# Patient Record
Sex: Female | Born: 1990 | Race: Black or African American | Hispanic: No | Marital: Single | Smoking: Never smoker
Health system: Southern US, Community
[De-identification: ages and names within clinical notes are randomized; demographics above are authoritative.]

## PROBLEM LIST (undated history)

## (undated) DIAGNOSIS — Z789 Other specified health status: Secondary | ICD-10-CM

## (undated) DIAGNOSIS — N946 Dysmenorrhea, unspecified: Secondary | ICD-10-CM

## (undated) HISTORY — PX: NO PAST SURGERIES: SHX2092

---

## 2012-03-16 ENCOUNTER — Encounter (HOSPITAL_COMMUNITY): Payer: Self-pay | Admitting: *Deleted

## 2012-03-16 DIAGNOSIS — Y939 Activity, unspecified: Secondary | ICD-10-CM | POA: Insufficient documentation

## 2012-03-16 DIAGNOSIS — N946 Dysmenorrhea, unspecified: Secondary | ICD-10-CM | POA: Insufficient documentation

## 2012-03-16 DIAGNOSIS — S8990XA Unspecified injury of unspecified lower leg, initial encounter: Secondary | ICD-10-CM | POA: Insufficient documentation

## 2012-03-16 DIAGNOSIS — Y929 Unspecified place or not applicable: Secondary | ICD-10-CM | POA: Insufficient documentation

## 2012-03-16 DIAGNOSIS — M2540 Effusion, unspecified joint: Secondary | ICD-10-CM | POA: Insufficient documentation

## 2012-03-16 DIAGNOSIS — S99929A Unspecified injury of unspecified foot, initial encounter: Secondary | ICD-10-CM | POA: Insufficient documentation

## 2012-03-16 DIAGNOSIS — W06XXXA Fall from bed, initial encounter: Secondary | ICD-10-CM | POA: Insufficient documentation

## 2012-03-16 NOTE — ED Notes (Signed)
Pt to ED c/o of her R "knee popping out" after she jumped up on her bed.  States she could not move the limb until the knee "popped back in place".  Pt was able to ambulate to the car with help.

## 2012-03-17 ENCOUNTER — Emergency Department (HOSPITAL_COMMUNITY)
Admission: EM | Admit: 2012-03-17 | Discharge: 2012-03-17 | Disposition: A | Discharge status: Home / Self Care | Payer: BC Managed Care – PPO | Attending: Emergency Medicine | Admitting: Emergency Medicine

## 2012-03-17 ENCOUNTER — Emergency Department (HOSPITAL_COMMUNITY): Payer: BC Managed Care – PPO

## 2012-03-17 DIAGNOSIS — M25569 Pain in unspecified knee: Secondary | ICD-10-CM

## 2012-03-17 HISTORY — DX: Dysmenorrhea, unspecified: N94.6

## 2012-03-17 MED ORDER — IBUPROFEN 400 MG PO TABS
600.0000 mg | ORAL_TABLET | Freq: Once | ORAL | Status: DC
  Filled 2012-03-17: qty 1

## 2012-03-17 MED ORDER — IBUPROFEN 100 MG/5ML PO SUSP
600.0000 mg | Freq: Once | ORAL | Status: AC
  Administered 2012-03-17: 600 mg via ORAL
  Filled 2012-03-17 (×2): qty 30

## 2012-03-17 MED ORDER — IBUPROFEN 600 MG PO TABS: 600.0000 mg | ORAL_TABLET | Freq: Four times a day (QID) | ORAL | Status: DC | PRN

## 2012-03-17 NOTE — ED Provider Notes (Signed)
History     CSN: [id number]  Arrival date & time 03/16/12  2344   First MD Initiated Contact with Patient 03/17/12 0012      Chief Complaint  Patient presents with  . Knee Pain    (Consider location/radiation/quality/duration/timing/severity/associated sxs/prior treatment) HPI Comments: Was in bed jumped up and felt R knee cap POP out of place states it was to the side instead of in the middle she tried to straighten her leg and it popped back   Patient is a 21 y.o. female presenting with knee pain. The history is provided by the patient.  Knee Pain This is a new problem. The current episode started today. The problem has been gradually improving. Associated symptoms include joint swelling. Pertinent negatives include no chills, fever, numbness or weakness.    Past Medical History  Diagnosis Date  . Dysmenorrhea     History reviewed. No pertinent past surgical history.  No family history on file.  History  Substance Use Topics  . Smoking status: Never Smoker   . Smokeless tobacco: Not on file  . Alcohol Use: No    OB History    Grav Para Term Preterm Abortions TAB SAB Ect Mult Living                  Review of Systems  Constitutional: Negative for fever and chills.  Musculoskeletal: Positive for joint swelling and gait problem.  Neurological: Negative for weakness and numbness.    Allergies  Review of patient's allergies indicates no known allergies.  Home Medications  No current outpatient prescriptions on file.  BP 115/80  Pulse 82  Temp 98.4 F (36.9 C)  Resp 16  SpO2 100%  LMP 03/03/2012  Physical Exam  Constitutional: She is oriented to person, place, and time. She appears well-developed and well-nourished.  HENT:  Head: Normocephalic.  Neck: Normal range of motion.  Cardiovascular: Normal rate.   Pulmonary/Chest: Effort normal.  Musculoskeletal: She exhibits tenderness. She exhibits no edema.       Pteitn can not fully straighten her R  leg no joint swelling exam limited by pain   Neurological: She is alert and oriented to person, place, and time.  Skin: Skin is warm and dry. No rash noted. No erythema.    ED Course  Procedures (including critical care time)  Labs Reviewed - No data to display Dg Knee Complete 4 Views Right  03/17/2012  *RADIOLOGY REPORT*  Clinical Data: Injury, pain.  RIGHT KNEE - COMPLETE 4+ VIEW  Comparison: None.  Findings: Imaged bones, joints and soft tissues appear normal.  IMPRESSION: Negative exam.   Original Report Authenticated By: Holley Dexter, M.D.      1. Knee pain       MDM   Xray reviewed no fracture or dislocation DC home with knee sleeve and Ibuprofen         Arman Filter, NP 03/17/12 0127

## 2012-03-17 NOTE — ED Provider Notes (Signed)
Medical screening examination/treatment/procedure(s) were performed by non-physician practitioner and as supervising physician I was immediately available for consultation/collaboration.  Malkie Wille K Keeghan Mcintire-Rasch, MD 03/17/12 0238 

## 2012-03-17 NOTE — ED Notes (Signed)
Pt reports jumping on her bed tonight when she felt her (R) knee "pop" out. Pt unable to fully extend her knee

## 2013-05-26 ENCOUNTER — Encounter (HOSPITAL_COMMUNITY): Payer: Self-pay | Admitting: *Deleted

## 2013-05-26 ENCOUNTER — Inpatient Hospital Stay (HOSPITAL_COMMUNITY)
Admission: AD | Admit: 2013-05-26 | Discharge: 2013-05-26 | Disposition: A | Discharge status: Home / Self Care | Payer: BC Managed Care – PPO | Source: Ambulatory Visit | Attending: Obstetrics & Gynecology | Admitting: Obstetrics & Gynecology

## 2013-05-26 DIAGNOSIS — D649 Anemia, unspecified: Secondary | ICD-10-CM | POA: Insufficient documentation

## 2013-05-26 DIAGNOSIS — N938 Other specified abnormal uterine and vaginal bleeding: Secondary | ICD-10-CM

## 2013-05-26 DIAGNOSIS — N949 Unspecified condition associated with female genital organs and menstrual cycle: Secondary | ICD-10-CM | POA: Insufficient documentation

## 2013-05-26 DIAGNOSIS — N925 Other specified irregular menstruation: Secondary | ICD-10-CM

## 2013-05-26 HISTORY — DX: Other specified health status: Z78.9

## 2013-05-26 LAB — URINALYSIS, ROUTINE W REFLEX MICROSCOPIC
Bilirubin Urine: NEGATIVE
Glucose, UA: NEGATIVE mg/dL
Ketones, ur: NEGATIVE mg/dL
Leukocytes, UA: NEGATIVE
NITRITE: NEGATIVE
Protein, ur: NEGATIVE mg/dL
UROBILINOGEN UA: 0.2 mg/dL (ref 0.0–1.0)
pH: 5.5 (ref 5.0–8.0)

## 2013-05-26 LAB — COMPREHENSIVE METABOLIC PANEL
ALK PHOS: 50 U/L (ref 39–117)
ALT: 6 U/L (ref 0–35)
AST: 18 U/L (ref 0–37)
Albumin: 3.9 g/dL (ref 3.5–5.2)
BUN: 5 mg/dL — ABNORMAL LOW (ref 6–23)
CALCIUM: 9.4 mg/dL (ref 8.4–10.5)
CO2: 24 meq/L (ref 19–32)
Chloride: 103 mEq/L (ref 96–112)
Creatinine, Ser: 0.68 mg/dL (ref 0.50–1.10)
GFR calc Af Amer: >90 mL/min (ref >90)
GFR calc non Af Amer: >90 mL/min (ref >90)
Glucose, Bld: 94 mg/dL (ref 70–99)
POTASSIUM: 4 meq/L (ref 3.7–5.3)
SODIUM: 139 meq/L (ref 137–147)
TOTAL PROTEIN: 6.9 g/dL (ref 6.0–8.3)
Total Bilirubin: <0.2 mg/dL — ABNORMAL LOW (ref 0.3–1.2)

## 2013-05-26 LAB — CBC
HCT: 24.8 % — ABNORMAL LOW (ref 36.0–46.0)
HEMOGLOBIN: 7.1 g/dL — AB (ref 12.0–15.0)
MCH: 20.9 pg — AB (ref 26.0–34.0)
MCHC: 28.6 g/dL — AB (ref 30.0–36.0)
MCV: 73.2 fL — ABNORMAL LOW (ref 78.0–100.0)
PLATELETS: 408 10*3/uL — AB (ref 150–400)
RBC: 3.39 MIL/uL — AB (ref 3.87–5.11)
RDW: 16.4 % — ABNORMAL HIGH (ref 11.5–15.5)
WBC: 4.5 10*3/uL (ref 4.0–10.5)

## 2013-05-26 LAB — URINE MICROSCOPIC-ADD ON

## 2013-05-26 LAB — POCT PREGNANCY, URINE: PREG TEST UR: NEGATIVE

## 2013-05-26 MED ORDER — ESTROGENS CONJUGATED 1.25 MG PO TABS: 1.2500 mg | ORAL_TABLET | Freq: Every day | ORAL | Status: AC

## 2013-05-26 MED ORDER — INTEGRA F 125-1 MG PO CAPS: 1.0000 | ORAL_CAPSULE | Freq: Two times a day (BID) | ORAL | Status: AC

## 2013-05-26 MED ORDER — SODIUM CHLORIDE 0.9 % IV SOLN
INTRAVENOUS | Status: DC
  Administered 2013-05-26: 22:00:00 via INTRAVENOUS

## 2013-05-26 MED ORDER — FERUMOXYTOL INJECTION 510 MG/17 ML
510.0000 mg | Freq: Once | INTRAVENOUS | Status: AC
  Administered 2013-05-26: 510 mg via INTRAVENOUS
  Filled 2013-05-26: qty 17

## 2013-05-26 NOTE — MAU Note (Signed)
Three months [facility] and have been bleeding ever since. I'm fatigued and weak. Some dizziness. Before i left [employer]choool for holidays the [employer] doctor said my blood was dropping but still in normal range. Doctor gave me Estrogen and told me to take vitamins.

## 2013-05-26 NOTE — Progress Notes (Signed)
W. Muhammed CNM in earlier to discuss test results and d/c plan. Written and verbal d/c instructions given and understanding voiced. 

## 2013-05-26 NOTE — MAU Provider Note (Signed)
History     CSN: [id number]  Arrival date and time: 05/26/13 [id number]   First Provider Initiated Contact with Patient 05/26/13 2012      Chief Complaint  Patient presents with  . Vaginal Bleeding   HPI  Pt is a 23 yo G0P0 here with report of vaginal bleeding.  [facility] and have been bleeding ever since.  Reports bleeding every day and needing 8-10 geriatric diapers a day in the past two weeks   Passing plum sized clot "every time I stand.   I'm fatigued and weak". Some dizziness. Before i left [employer]choool for holidays [facility] said my blood was dropping but still in normal range.  Given RX for estrogen and told to take vitamins.     Past Medical History  Diagnosis Date  . Dysmenorrhea   . Medical history non-contributory     Past Surgical History  Procedure Laterality Date  . No past surgeries      Family History  Problem Relation Age of Onset  . Diabetes Father   . Cancer Maternal Aunt   . Diabetes Maternal Aunt   . Cancer Maternal Grandmother   . Diabetes Maternal Grandfather   . Arthritis Paternal Grandmother   . Cancer Paternal Grandfather     History  Substance Use Topics  . Smoking status: Never Smoker   . Smokeless tobacco: Not on file  . Alcohol Use: No    Allergies: No Known Allergies  Prescriptions prior to admission  Medication Sig Dispense Refill  . acetaminophen (TYLENOL) 500 MG tablet Take 500 mg by mouth every 6 (six) hours as needed.      . Multiple Vitamins-Minerals (MULTIVITAMIN WITH MINERALS) tablet Take 1 tablet by mouth daily.      Marland Kitchen. ibuprofen (ADVIL,MOTRIN) 600 MG tablet Take 1 tablet (600 mg total) by mouth every 6 (six) hours as needed for pain.  30 tablet  0    Review of Systems  Constitutional: Positive for malaise/fatigue.  Cardiovascular: Positive for palpitations. Negative for chest pain.  Gastrointestinal: Positive for nausea. Negative for vomiting and abdominal pain.  Genitourinary:       Vaginal bleeding   Neurological: Positive for dizziness. Negative for loss of consciousness.   Physical Exam   Blood pressure 134/78, pulse 91, temperature 98.4 F (36.9 C), temperature source Oral, resp. rate 20, height 5\' 3"  (1.6 m), weight 60.782 kg (134 lb), last menstrual period 02/01/2013.  Physical Exam  Constitutional: She is oriented to person, place, and time. She appears well-developed and well-nourished. No distress.  HENT:  Head: Normocephalic.  Neck: Normal range of motion. Neck supple.  Cardiovascular: Normal rate, regular rhythm and normal heart sounds.   Respiratory: Effort normal and breath sounds normal.  GI: Soft. There is no tenderness.  Genitourinary: There is bleeding (negative clots; scant to moderate bleeding) around the vagina.  Neurological: She is alert and oriented to person, place, and time. She has normal reflexes.  Skin: Skin is warm and dry.    MAU Course  Procedures Results for orders placed during the hospital encounter of 05/26/13 (from the past 24 hour(s))  URINALYSIS, ROUTINE W REFLEX MICROSCOPIC     Status: Abnormal   Collection Time    05/26/13  7:45 PM      Result Value Range   Color, Urine YELLOW  YELLOW   APPearance CLEAR  CLEAR   Specific Gravity, Urine <1.005 (*) 1.005 - 1.030   pH 5.5  5.0 - 8.0  Glucose, UA NEGATIVE  NEGATIVE mg/dL   Hgb urine dipstick LARGE (*) NEGATIVE   Bilirubin Urine NEGATIVE  NEGATIVE   Ketones, ur NEGATIVE  NEGATIVE mg/dL   Protein, ur NEGATIVE  NEGATIVE mg/dL   Urobilinogen, UA 0.2  0.0 - 1.0 mg/dL   Nitrite NEGATIVE  NEGATIVE   Leukocytes, UA NEGATIVE  NEGATIVE  URINE MICROSCOPIC-ADD ON     Status: Abnormal   Collection Time    05/26/13  7:45 PM      Result Value Range   Squamous Epithelial / LPF FEW (*) RARE   WBC, UA 0-2  <3 WBC/hpf   RBC / HPF 21-50  <3 RBC/hpf   Bacteria, UA RARE  RARE  POCT PREGNANCY, URINE     Status: None   Collection Time    05/26/13  7:54 PM      Result Value Range   Preg Test, Ur  NEGATIVE  NEGATIVE  CBC     Status: Abnormal   Collection Time    05/26/13  8:25 PM      Result Value Range   WBC 4.5  4.0 - 10.5 K/uL   RBC 3.39 (*) 3.87 - 5.11 MIL/uL   Hemoglobin 7.1 (*) 12.0 - 15.0 g/dL   HCT 16.1 (*) 09.6 - 04.5 %   MCV 73.2 (*) 78.0 - 100.0 fL   MCH 20.9 (*) 26.0 - 34.0 pg   MCHC 28.6 (*) 30.0 - 36.0 g/dL   RDW 40.9 (*) 81.1 - 91.4 %   Platelets 408 (*) 150 - 400 K/uL  COMPREHENSIVE METABOLIC PANEL     Status: Abnormal   Collection Time    05/26/13  8:25 PM      Result Value Range   Sodium 139  137 - 147 mEq/L   Potassium 4.0  3.7 - 5.3 mEq/L   Chloride 103  96 - 112 mEq/L   CO2 24  19 - 32 mEq/L   Glucose, Bld 94  70 - 99 mg/dL   BUN 5 (*) 6 - 23 mg/dL   Creatinine, Ser 7.82  0.50 - 1.10 mg/dL   Calcium 9.4  8.4 - 95.6 mg/dL   Total Protein 6.9  6.0 - 8.3 g/dL   Albumin 3.9  3.5 - 5.2 g/dL   AST 18  0 - 37 U/L   ALT 6  0 - 35 U/L   Alkaline Phosphatase 50  39 - 117 U/L   Total Bilirubin <0.2 (*) 0.3 - 1.2 mg/dL   GFR calc non Af Amer >90  >90 mL/min   GFR calc Af Amer >90  >90 mL/min   Consulted with Dr. Erin Fulling > reviewed HPI/exam/labs > given IV Iron and discharge home on Premarin and Integra.   I dose of IV Iron I L NS Assessment and Plan  Abnormal Uterine Bleeding Anemia  Plan: Discharge to home RX Integra BID Premarin 2.5 mg QD Follow-up in GYN clinic in one month    [facility] 05/26/2013, 8:13 PM

## 2013-05-26 NOTE — Discharge Instructions (Signed)
Abnormal Uterine Bleeding °Abnormal uterine bleeding means bleeding from the vagina that is not your normal menstrual period. This can be: °· Bleeding or spotting between periods. °· Bleeding after sex (sexual intercourse). °· Bleeding that is heavier or more than normal. °· Periods that last longer than usual. °· Bleeding after menopause. °There are many problems that may cause this. Treatment will depend on the cause of the bleeding. Any kind of bleeding that is not normal should be reviewed by your doctor.  °HOME CARE °Watch your condition for any changes. These actions may lessen any discomfort you are having: °· Do not use tampons or douches as told by your doctor. °· Change your pads often. °You should get regular pelvic exams and Pap tests. Keep all appointments for tests as told by your doctor. °GET HELP IF: °· You are bleeding for more than 1 week. °· You feel dizzy at times. °GET HELP RIGHT AWAY IF:  °· You pass out. °· You have to change pads every 15 to 30 minutes. °· You have belly pain. °· You have a fever. °· You become sweaty or weak. °· You are passing large blood clots from the vagina. °· You feel sick to your stomach (nauseous) and throw up (vomit). °MAKE SURE YOU: °· Understand these instructions. °· Will watch your condition. °· Will get help right away if you are not doing well or get worse. °Document Released: 02/28/2009 Document Revised: 02/21/2013 Document Reviewed: 11/30/2012 °ExitCare® Patient Information ©2014 [employer]. ° °Anemia, Nonspecific °Anemia is a condition in which the concentration of red blood cells or hemoglobin in the blood is below normal. Hemoglobin is a substance in red blood cells that carries oxygen to the tissues of the body. Anemia results in not enough oxygen reaching these tissues.  °CAUSES  °Common causes of anemia include:  °· Excessive bleeding. Bleeding may be internal or external. This includes excessive bleeding from periods (in women) or from the  intestine.   °· Poor nutrition.   °· Chronic kidney, thyroid, and liver disease.  °· Bone marrow disorders that decrease red blood cell production. °· Cancer and treatments for cancer. °· HIV, AIDS, and their treatments. °· Spleen problems that increase red blood cell destruction. °· Blood disorders. °· Excess destruction of red blood cells due to infection, medicines, and autoimmune disorders. °SIGNS AND SYMPTOMS  °· Minor weakness.   °· Dizziness.   °· Headache. °· Palpitations.   °· Shortness of breath, especially with exercise.   °· Paleness. °· Cold sensitivity. °· Indigestion. °· Nausea. °· Difficulty sleeping. °· Difficulty concentrating. °Symptoms may occur suddenly or they may develop slowly.  °DIAGNOSIS  °Additional blood tests are often needed. These help your health care provider determine the best treatment. Your health care provider will check your stool for blood and look for other causes of blood loss.  °TREATMENT  °Treatment varies depending on the cause of the anemia. Treatment can include:  °· Supplements of iron, vitamin B12, or folic acid.   °· Hormone medicines.   °· A blood transfusion. This may be needed if blood loss is severe.   °· Hospitalization. This may be needed if there is significant continual blood loss.   °· Dietary changes. °· Spleen removal. °HOME CARE INSTRUCTIONS °Keep all follow-up appointments. It often takes many weeks to correct anemia, and having your health care provider check on your condition and your response to treatment is very important. °SEEK IMMEDIATE MEDICAL CARE IF:  °· You develop extreme weakness, shortness of breath, or chest pain.   °· You become   dizzy or have trouble concentrating. °· You develop heavy vaginal bleeding.   °· You develop a rash.   °· You have bloody or black, tarry stools.   °· You faint.   °· You vomit up blood.   °· You vomit repeatedly.   °· You have abdominal pain. °· You have a fever or persistent symptoms for more than 2 3 days.    °· You have a fever and your symptoms suddenly get worse.   °· You are dehydrated.   °MAKE SURE YOU: °· Understand these instructions. °· Will watch your condition. °· Will get help right away if you are not doing well or get worse. °Document Released: 06/10/2004 Document Revised: 01/03/2013 Document Reviewed: 10/27/2012 °ExitCare® Patient Information ©2014 [employer]. ° °

## 2013-05-26 NOTE — MAU Provider Note (Signed)
Attestation of Attending Supervision of Advanced Practitioner (CNM/NP): Evaluation and management procedures were performed by the Advanced Practitioner under my supervision and collaboration.  I have reviewed the Advanced Practitioner's note and chart, and I agree with the management and plan.  HARRAWAY-SMITH, Rowene Suto 11:40 PM   \  

## 2013-05-28 ENCOUNTER — Other Ambulatory Visit: Payer: Self-pay | Admitting: Advanced Practice Midwife

## 2013-05-28 MED ORDER — ESTRADIOL 1 MG PO TABS: 1.0000 mg | ORAL_TABLET | Freq: Every day | ORAL | Status: AC

## 2013-05-28 NOTE — Progress Notes (Signed)
Could not afford the Premarin.  Consulted Dr Shawnie PonsPratt. Will order Estradiol 1mg  qd #30, RF 1  [facility] [address]

## 2013-07-04 ENCOUNTER — Ambulatory Visit (INDEPENDENT_AMBULATORY_CARE_PROVIDER_SITE_OTHER): Payer: BC Managed Care – PPO | Admitting: Obstetrics and Gynecology

## 2013-07-04 VITALS — BP 117/79 | HR 88 | Temp 97.4°F | Ht 63.0 in | Wt 130.5 lb

## 2013-07-04 DIAGNOSIS — N926 Irregular menstruation, unspecified: Secondary | ICD-10-CM

## 2013-07-04 DIAGNOSIS — N939 Abnormal uterine and vaginal bleeding, unspecified: Secondary | ICD-10-CM

## 2013-07-04 NOTE — Progress Notes (Signed)
Patient ID: Barry BrunnerKenyarri Richardson, female   DOB: 05/19/1990, 22 y.o.   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 Patient was treated with estrogen and iron on 05/28/2013 but was only able to take it for 1 week as she experienced some abdominal pain and constipation. Patient reports feeling well. She denies any dizziness or generalized weakness. She is no longer bleeding. She no longer desires contraception as she is afraid of experiencing the same complications.   Other birth control options were discussed. Patient verbalized understanding and will return when ready. She is not currently sexually active.  Advised to use condoms. RTC prn

## 2013-07-04 NOTE — Progress Notes (Signed)
03/03/2013 [facility], spotting today

## 2013-07-17 ENCOUNTER — Encounter: Payer: Self-pay | Admitting: Advanced Practice Midwife

## 2013-07-17 ENCOUNTER — Encounter: Payer: Self-pay | Admitting: *Deleted

## 2014-03-27 ENCOUNTER — Ambulatory Visit (INDEPENDENT_AMBULATORY_CARE_PROVIDER_SITE_OTHER): Payer: BC Managed Care – PPO | Admitting: Family Medicine

## 2014-03-27 VITALS — BP 130/80 | HR 94 | Temp 98.9°F | Resp 16 | Ht 63.0 in | Wt 139.0 lb

## 2014-03-27 DIAGNOSIS — F4000 Agoraphobia, unspecified: Secondary | ICD-10-CM

## 2014-03-27 DIAGNOSIS — R1084 Generalized abdominal pain: Secondary | ICD-10-CM

## 2014-03-27 DIAGNOSIS — F419 Anxiety disorder, unspecified: Secondary | ICD-10-CM

## 2014-03-27 LAB — POCT URINALYSIS DIPSTICK
Bilirubin, UA: NEGATIVE
Glucose, UA: NEGATIVE
Ketones, UA: NEGATIVE
LEUKOCYTES UA: NEGATIVE
NITRITE UA: NEGATIVE
PH UA: 7
PROTEIN UA: NEGATIVE
Spec Grav, UA: 1.015
UROBILINOGEN UA: 0.2

## 2014-03-27 LAB — POCT UA - MICROSCOPIC ONLY
Bacteria, U Microscopic: NEGATIVE
CASTS, UR, LPF, POC: NEGATIVE
Crystals, Ur, HPF, POC: NEGATIVE
MUCUS UA: NEGATIVE
WBC, Ur, HPF, POC: NEGATIVE
YEAST UA: NEGATIVE

## 2014-03-27 LAB — POCT CBC
GRANULOCYTE PERCENT: 54.2 % (ref 37–80)
HCT, POC: 39.3 % (ref 37.7–47.9)
Hemoglobin: 11.6 g/dL — AB (ref 12.2–16.2)
Lymph, poc: 2.2 (ref 0.6–3.4)
MCH, POC: 25.6 pg — AB (ref 27–31.2)
MCHC: 29.4 g/dL — AB (ref 31.8–35.4)
MCV: 86.8 fL (ref 80–97)
MID (CBC): 0.3 (ref 0–0.9)
MPV: 9.1 fL (ref 0–99.8)
PLATELET COUNT, POC: 321 10*3/uL (ref 142–424)
POC GRANULOCYTE: 2.9 (ref 2–6.9)
POC LYMPH %: 40.9 % (ref 10–50)
POC MID %: 4.9 %M (ref 0–12)
RBC: 4.53 M/uL (ref 4.04–5.48)
RDW, POC: 16.7 %
WBC: 5.3 10*3/uL (ref 4.6–10.2)

## 2014-03-27 LAB — POCT URINE PREGNANCY: PREG TEST UR: NEGATIVE

## 2014-03-27 MED ORDER — FLUOXETINE HCL 20 MG PO TABS: 20.0000 mg | ORAL_TABLET | Freq: Every day | ORAL | Status: AC

## 2014-03-27 NOTE — Progress Notes (Addendum)
Subjective:   This chart was scribed for Shade Flood, MD at [facility] by Annye Asa, medical scribe. This patient was seen in room 14 and the patient's care was started at 7:57 PM.    Patient ID: Tanya Richardson, female    DOB: March 02, 1991, 23 y.o.   MRN: [id number]  Chief Complaint  Patient presents with  . Abdominal Pain    Possible Anxiety   There are no active problems to display for this patient.  Past Medical History  Diagnosis Date  . Dysmenorrhea   . Medical history non-contributory    Past Surgical History  Procedure Laterality Date  . No past surgeries     Allergies  Allergen Reactions  . Peanut-Containing Drug Products Anaphylaxis  . Banana Rash  . Strawberry Rash  . Watermelon [Citrullus Vulgaris] Rash   Prior to Admission medications   Medication Sig Start Date End Date Taking? Authorizing Provider  Multiple Vitamins-Minerals (MULTIVITAMIN WITH MINERALS) tablet Take 1 tablet by mouth daily.   Yes Historical Provider, MD  estradiol (ESTRACE) 1 MG tablet Take 1 tablet (1 mg total) by mouth daily. 05/28/13 05/28/14  Aviva Signs, CNM  estrogens, conjugated, (PREMARIN) 1.25 MG tablet Take 1 tablet (1.25 mg total) by mouth daily. 05/26/13   Marlis Edelson, CNM  Fe Fum-FePoly-FA-Vit C-Vit B3 (INTEGRA F) 125-1 MG CAPS Take 1 tablet by mouth 2 (two) times daily. 05/26/13   Marlis Edelson, CNM   History   Social History  . Marital Status: Single    Spouse Name: N/A    Number of Children: N/A  . Years of Education: N/A   Occupational History  . Not on file.   Social History Main Topics  . Smoking status: Never Smoker   . Smokeless tobacco: Not on file  . Alcohol Use: No  . Drug Use: No  . Sexual Activity: Yes    Birth Control/ Protection: Injection   Other Topics Concern  . Not on file   Social History Narrative   HPI   Tanya Richardson is a 23 y.o. female  PCP: Default, Provider, MD *She regularly utilizes [employer], as she lives on campus there.   HPI Comments: Tanya Richardson is a 23 y.o. female who presents to the [facility] complaining of 6 months of abdominal pain and nausea. Patient reports that she has trouble sitting in class; she feels as though she may vomit, pass gas, has the sudden urge to use the bathroom. She notes occasional constipation and diarrhea.   Patient reports she had a depo shot in October 14; she had weight gain, weight loss, and heavy breakthrough bleeding for 7 months. She became anemic as a result. She does not have a doctor to manage her anemia and would manage the associated symptoms through the ED. She saw several other doctors in the area, including one visit to a GI doctor several months ago for similar symptoms to today. She was counseled to utilize probiotics - she has tried these with minimal relief. She is looking to follow up with her GI doctor again soon.   She regularly takes multivitamins for women and melatonin to sleep. She was on estrogen and iron in relation to her anemia (5-6 months ago). She has not had her blood checked recently. She denies weakness at this time.   She has a history of depression (diagnosed a year or two ago); she was on medication but stopped  because she felt better. She reports 6 months to 1 year of anxiety associated with school; she feels as though this anxiety may exacerbate her symptoms, and that her symptoms exacerbate her anxiety. She saw a counselor when she was depressed and once again today in relation to her anxiety; no conclusions were reached with her counselor today. She denies thoughts of SI or HI at this time; she had prior SI with previous depression, and also notes a history of hitting herself as a young child (66 yrs old).   Her menstrual cycle is normal now; she is on her period today. She reports that she was sexually active within the last few weeks; her last sexual encounter was protected (condoms)  but she has had unprotected sex in the past.   Review of Systems  Gastrointestinal: Positive for nausea, abdominal pain, diarrhea and constipation. Negative for vomiting.  Neurological: Negative for weakness.  Psychiatric/Behavioral: The patient is nervous/anxious.       Objective:   Physical Exam  Constitutional: She is oriented to person, place, and time. She appears well-developed and well-nourished.  HENT:  Head: Normocephalic and atraumatic.  Neck: No tracheal deviation present.  Cardiovascular: Normal rate and regular rhythm.  Exam reveals no gallop and no friction rub.   No murmur heard. Pulmonary/Chest: Effort normal.  Abdominal: Soft. She exhibits no distension.  Left upper quadrant minimal discomfort; no focal tenderness otherwise  Neurological: She is alert and oriented to person, place, and time.  Skin: Skin is warm and dry.  Psychiatric: She has a normal mood and affect. Her behavior is normal.  Nursing note and vitals reviewed.  Filed Vitals:   03/27/14 1858  BP: 130/80  Pulse: 94  Temp: 98.9 F (37.2 C)  TempSrc: Oral  Resp: 16  Height: 5\' 3"  (1.6 m)  Weight: 139 lb (63.05 kg)  SpO2: 98%   Results for orders placed or performed in visit on 03/27/14  POCT CBC  Result Value Ref Range   WBC 5.3 4.6 - 10.2 K/uL   Lymph, poc 2.2 0.6 - 3.4   POC LYMPH PERCENT 40.9 10 - 50 %L   MID (cbc) 0.3 0 - 0.9   POC MID % 4.9 0 - 12 %M   POC Granulocyte 2.9 2 - 6.9   Granulocyte percent 54.2 37 - 80 %G   RBC 4.53 4.04 - 5.48 M/uL   Hemoglobin 11.6 (A) 12.2 - 16.2 g/dL   HCT, POC 96.0 45.4 - 47.9 %   MCV 86.8 80 - 97 fL   MCH, POC 25.6 (A) 27 - 31.2 pg   MCHC 29.4 (A) 31.8 - 35.4 g/dL   RDW, POC 09.8 %   Platelet Count, POC 321 142 - 424 K/uL   MPV 9.1 0 - 99.8 fL  POCT urine pregnancy  Result Value Ref Range   Preg Test, Ur Negative   POCT urinalysis dipstick  Result Value Ref Range   Color, UA yellowish pink    Clarity, UA hazy    Glucose, UA neg     Bilirubin, UA neg    Ketones, UA neg    Spec Grav, UA 1.015    Blood, UA large    pH, UA 7.0    Protein, UA neg    Urobilinogen, UA 0.2    Nitrite, UA neg    Leukocytes, UA Negative   POCT UA - Microscopic Only  Result Value Ref Range   WBC, Ur, HPF, POC neg    RBC,  urine, microscopic tntc    Bacteria, U Microscopic neg    Mucus, UA neg    Epithelial cells, urine per micros 0-1    Crystals, Ur, HPF, POC neg    Casts, Ur, LPF, POC neg    Yeast, UA neg   currently on menses.       Assessment & Plan:   Tanya BrunnerKenyarri Richardson is a 23 y.o. female Anxiety, Agoraphobia - Plan: POCT CBC, POCT urine pregnancy, POCT urinalysis dipstick, POCT UA - Microscopic Only, COMPLETE METABOLIC PANEL WITH GFR, Lipase, FLUoxetine (PROZAC) 20 MG tablet   - suspected GAD and now with component of agoraphobia over past 6 months. Continue counseling at school, agreed on trial of PRozac - initially at 20mg  qd - SED, and recheck in next 3 weeks with me or provider at school. TSH pending.    Abdominal pain, generalized - Plan: POCT CBC, POCT urine pregnancy, POCT urinalysis dipstick, POCT UA - Microscopic Only, COMPLETE METABOLIC PANEL WITH GFR, Lipase  -reassuring labs and exam in office. DDX includes IBS, but agree with further eval with gastroenterologist. rtc precautions.  Borderline anemia - otc iron QD and recheck labs in 4-6 weeks. Sooner if fatigue, or other sx's when anemic in past.    Meds ordered this encounter  Medications  . FLUoxetine (PROZAC) 20 MG tablet    Sig: Take 1 tablet (20 mg total) by mouth daily.    Dispense:  30 tablet    Refill:  1   Patient Instructions  Keep follow up with gastroenterology as planned. You should receive a call or letter about your lab results within the next week to 10 days.  We can start Prozac for anxiety, but keep follow up with counseling at school.  You can follow up there or here in next few weeks to continue this medicine.  You were borderline anemic  here tonight.   You can start over the counter iron once per day, make sure you are getting plenty of fiber and water in your diet.  Recheck blood count in next 4-6 weeks.  Return to the clinic or go to the nearest emergency room if any of your symptoms worsen or new symptoms occur.  Abdominal Pain Many things can cause abdominal pain. Usually, abdominal pain is not caused by a disease and will improve without treatment. It can often be observed and treated at home. Your health care provider will do a physical exam and possibly order blood tests and X-rays to help determine the seriousness of your pain. However, in many cases, more time must pass before a clear cause of the pain can be found. Before that point, your health care provider may not know if you need more testing or further treatment. HOME CARE INSTRUCTIONS  Monitor your abdominal pain for any changes. The following actions may help to alleviate any discomfort you are experiencing:  Only take over-the-counter or prescription medicines as directed by your health care provider.  Do not take laxatives unless directed to do so by your health care provider.  Try a clear liquid diet (broth, tea, or water) as directed by your health care provider. Slowly move to a bland diet as tolerated. SEEK MEDICAL CARE IF:  You have unexplained abdominal pain.  You have abdominal pain associated with nausea or diarrhea.  You have pain when you urinate or have a bowel movement.  You experience abdominal pain that wakes you in the night.  You have abdominal pain that is worsened or improved  by eating food.  You have abdominal pain that is worsened with eating fatty foods.  You have a fever. SEEK IMMEDIATE MEDICAL CARE IF:   Your pain does not go away within 2 hours.  You keep throwing up (vomiting).  Your pain is felt only in portions of the abdomen, such as the right side or the left lower portion of the abdomen.  You pass bloody or black  tarry stools. MAKE SURE YOU:  Understand these instructions.   Will watch your condition.   Will get help right away if you are not doing well or get worse.  Document Released: 02/10/2005 Document Revised: 05/08/2013 Document Reviewed: 01/10/2013 [facility] Patient Information 2015 [place], [place]. This information is not intended to replace advice given to you by your health care provider. Make sure you discuss any questions you have with your health care provider.     I personally performed the services described in this documentation, which was scribed in my presence. The recorded information has been reviewed and considered, and addended by me as needed.

## 2014-03-27 NOTE — Patient Instructions (Addendum)
Keep follow up with gastroenterology as planned. You should receive a call or letter about your lab results within the next week to 10 days.  We can start Prozac for anxiety, but keep follow up with counseling at school.  You can follow up there or here in next few weeks to continue this medicine.  You were borderline anemic here tonight.   You can start over the counter iron once per day, make sure you are getting plenty of fiber and water in your diet.  Recheck blood count in next 4-6 weeks.  Return to the clinic or go to the nearest emergency room if any of your symptoms worsen or new symptoms occur.  Abdominal Pain Many things can cause abdominal pain. Usually, abdominal pain is not caused by a disease and will improve without treatment. It can often be observed and treated at home. Your health care provider will do a physical exam and possibly order blood tests and X-rays to help determine the seriousness of your pain. However, in many cases, more time must pass before a clear cause of the pain can be found. Before that point, your health care provider may not know if you need more testing or further treatment. HOME CARE INSTRUCTIONS  Monitor your abdominal pain for any changes. The following actions may help to alleviate any discomfort you are experiencing:  Only take over-the-counter or prescription medicines as directed by your health care provider.  Do not take laxatives unless directed to do so by your health care provider.  Try a clear liquid diet (broth, tea, or water) as directed by your health care provider. Slowly move to a bland diet as tolerated. SEEK MEDICAL CARE IF:  You have unexplained abdominal pain.  You have abdominal pain associated with nausea or diarrhea.  You have pain when you urinate or have a bowel movement.  You experience abdominal pain that wakes you in the night.  You have abdominal pain that is worsened or improved by eating food.  You have abdominal  pain that is worsened with eating fatty foods.  You have a fever. SEEK IMMEDIATE MEDICAL CARE IF:   Your pain does not go away within 2 hours.  You keep throwing up (vomiting).  Your pain is felt only in portions of the abdomen, such as the right side or the left lower portion of the abdomen.  You pass bloody or black tarry stools. MAKE SURE YOU:  Understand these instructions.   Will watch your condition.   Will get help right away if you are not doing well or get worse.  Document Released: 02/10/2005 Document Revised: 05/08/2013 Document Reviewed: 01/10/2013 [facility] Patient Information 2015 [place], [place]. This information is not intended to replace advice given to you by your health care provider. Make sure you discuss any questions you have with your health care provider.

## 2014-03-28 LAB — COMPLETE METABOLIC PANEL WITH GFR
ALT: 9 U/L (ref 0–35)
AST: 23 U/L (ref 0–37)
Albumin: 4.8 g/dL (ref 3.5–5.2)
Alkaline Phosphatase: 57 U/L (ref 39–117)
BUN: 7 mg/dL (ref 6–23)
CALCIUM: 10.1 mg/dL (ref 8.4–10.5)
CHLORIDE: 102 meq/L (ref 96–112)
CO2: 26 mEq/L (ref 19–32)
CREATININE: 0.6 mg/dL (ref 0.50–1.10)
GFR, Est African American: >89 mL/min
GFR, Est Non African American: >89 mL/min
Glucose, Bld: 83 mg/dL (ref 70–99)
POTASSIUM: 4.1 meq/L (ref 3.5–5.3)
SODIUM: 137 meq/L (ref 135–145)
Total Bilirubin: 0.3 mg/dL (ref 0.2–1.2)
Total Protein: 8 g/dL (ref 6.0–8.3)

## 2014-03-28 LAB — LIPASE: Lipase: 30 U/L (ref 0–75)

## 2014-04-04 ENCOUNTER — Other Ambulatory Visit: Payer: Self-pay | Admitting: Gastroenterology

## 2014-04-04 DIAGNOSIS — R1030 Lower abdominal pain, unspecified: Secondary | ICD-10-CM

## 2014-04-10 ENCOUNTER — Ambulatory Visit
Admission: RE | Admit: 2014-04-10 | Discharge: 2014-04-10 | Disposition: A | Discharge status: Home / Self Care | Payer: BC Managed Care – PPO | Source: Ambulatory Visit | Attending: Gastroenterology | Admitting: Gastroenterology

## 2014-04-10 DIAGNOSIS — R1030 Lower abdominal pain, unspecified: Secondary | ICD-10-CM

## 2014-04-16 ENCOUNTER — Other Ambulatory Visit: Payer: BC Managed Care – PPO

## 2014-04-19 ENCOUNTER — Telehealth: Payer: Self-pay

## 2014-04-19 NOTE — Telephone Encounter (Signed)
Patient mother left voicemail on Thursday at 4:53 pm requesting lab results be faxed to [facility] at fax # [phone]. She is on patient DPR form. Cb# [phone]

## 2014-04-23 NOTE — Telephone Encounter (Signed)
Called [facility] to verify fax number since mom left a different fax. The fax number we have is correct and labs faxed thru [facility].

## 2014-06-03 ENCOUNTER — Ambulatory Visit: Payer: Self-pay | Admitting: Internal Medicine

## 2014-06-11 ENCOUNTER — Other Ambulatory Visit: Payer: Self-pay | Admitting: Gastroenterology

## 2014-06-11 DIAGNOSIS — K589 Irritable bowel syndrome without diarrhea: Secondary | ICD-10-CM

## 2014-06-13 ENCOUNTER — Ambulatory Visit
Admission: RE | Admit: 2014-06-13 | Discharge: 2014-06-13 | Disposition: A | Discharge status: Home / Self Care | Payer: Self-pay | Source: Ambulatory Visit | Attending: Gastroenterology | Admitting: Gastroenterology

## 2014-06-13 DIAGNOSIS — K589 Irritable bowel syndrome without diarrhea: Secondary | ICD-10-CM

## 2014-06-18 ENCOUNTER — Other Ambulatory Visit: Payer: Self-pay | Admitting: Gastroenterology

## 2014-06-18 ENCOUNTER — Encounter (INDEPENDENT_AMBULATORY_CARE_PROVIDER_SITE_OTHER): Payer: Self-pay

## 2014-06-18 ENCOUNTER — Ambulatory Visit
Admission: RE | Admit: 2014-06-18 | Discharge: 2014-06-18 | Disposition: A | Discharge status: Home / Self Care | Payer: BLUE CROSS/BLUE SHIELD | Source: Ambulatory Visit | Attending: Gastroenterology | Admitting: Gastroenterology

## 2014-06-18 DIAGNOSIS — K589 Irritable bowel syndrome without diarrhea: Secondary | ICD-10-CM

## 2014-06-27 ENCOUNTER — Other Ambulatory Visit: Payer: Self-pay

## 2014-07-08 ENCOUNTER — Ambulatory Visit
Admission: RE | Admit: 2014-07-08 | Discharge: 2014-07-08 | Disposition: A | Discharge status: Home / Self Care | Payer: BLUE CROSS/BLUE SHIELD | Source: Ambulatory Visit | Attending: Gastroenterology | Admitting: Gastroenterology

## 2014-07-08 DIAGNOSIS — K589 Irritable bowel syndrome without diarrhea: Secondary | ICD-10-CM

## 2016-04-12 IMAGING — RF DG UGI W/ HIGH DENSITY W/KUB
18 of 24 series · 18 of 24 positions shown · non-contrast
Comparison: Ultrasound of the abdomen of 04/10/2014

CLINICAL DATA: Irritable bowel syndrome, lower abdominal pain

EXAM:
UPPER GI SERIES WITH KUB
TECHNIQUE: After obtaining a scout radiograph a routine upper GI series was
performed using thin and high density barium.
FLUOROSCOPY TIME:  2 min 36 seconds

[Series 1: run · 1 of 1 slices shown (1 of 17)]
[im 1/1]
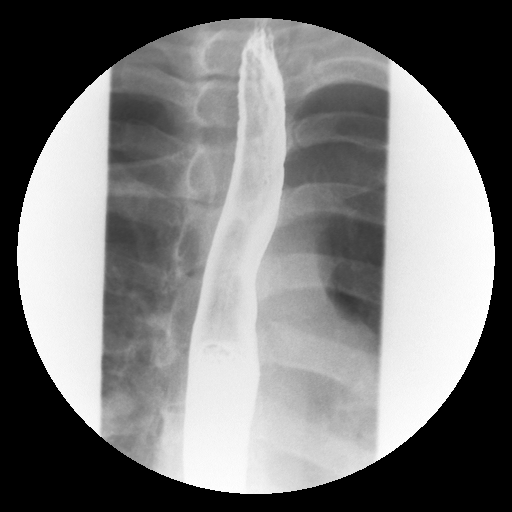

[Series 4: run · 1 of 1 slices shown (2 of 17)]
[im 1/1]
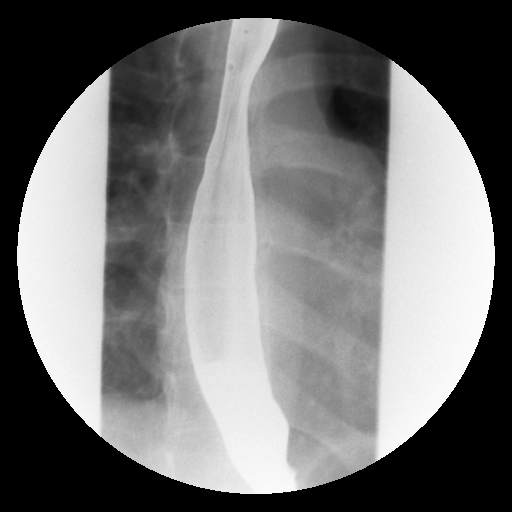

[Series 5: run · 1 of 1 slices shown (3 of 17)]
[im 1/1]
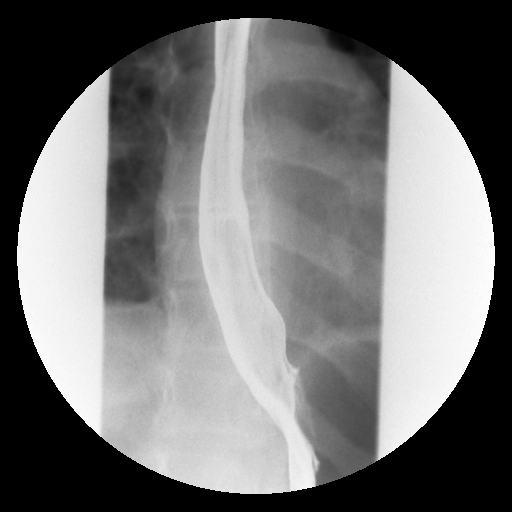

[Series 6: run · 1 of 1 slices shown (4 of 17)]
[im 1/1]
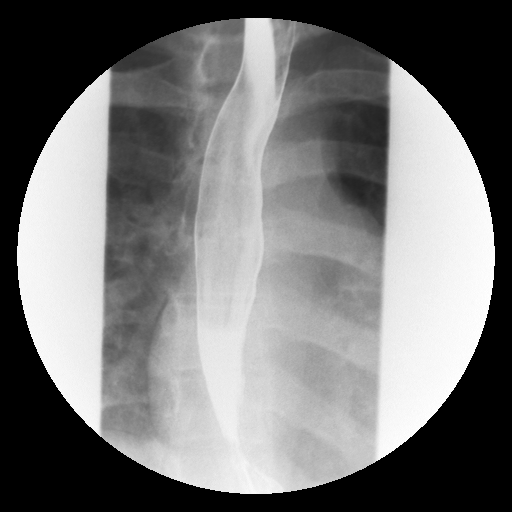

[Series 8: run · 1 of 1 slices shown (5 of 17)]
[im 1/1]
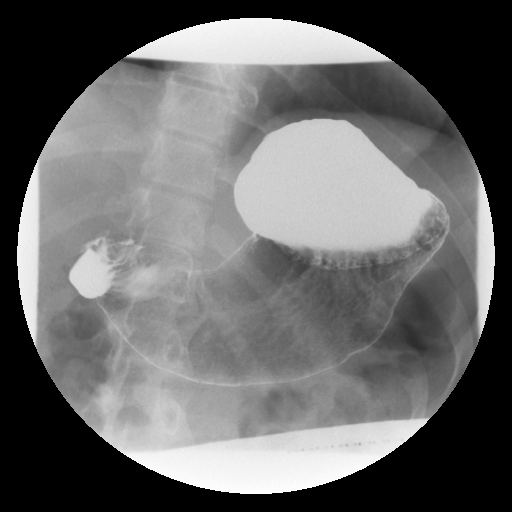

[Series 9: run · 1 of 1 slices shown (6 of 17)]
[im 1/1]
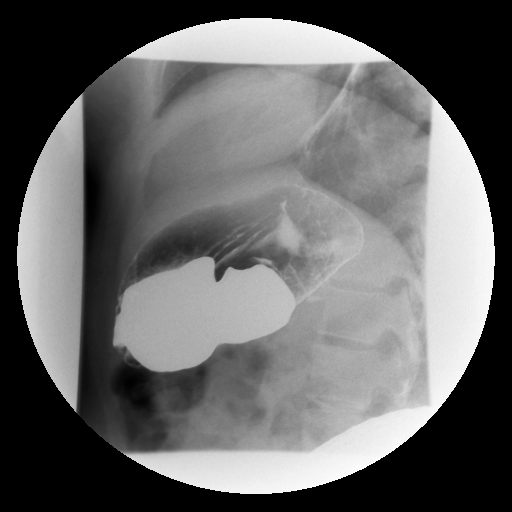

[Series 10: run · 1 of 1 slices shown (7 of 17)]
[im 1/1]
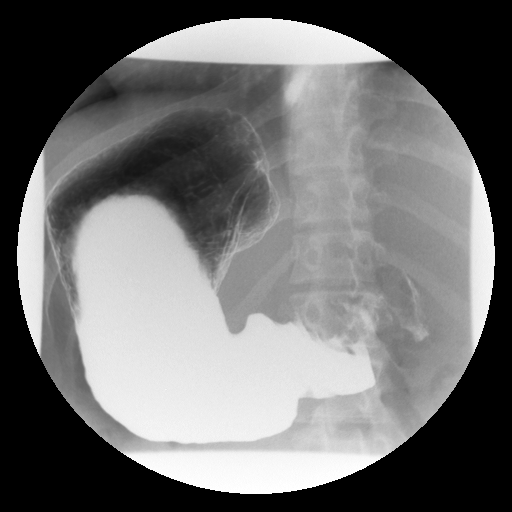

[Series 12: run · 1 of 1 slices shown (8 of 17)]
[im 1/1]
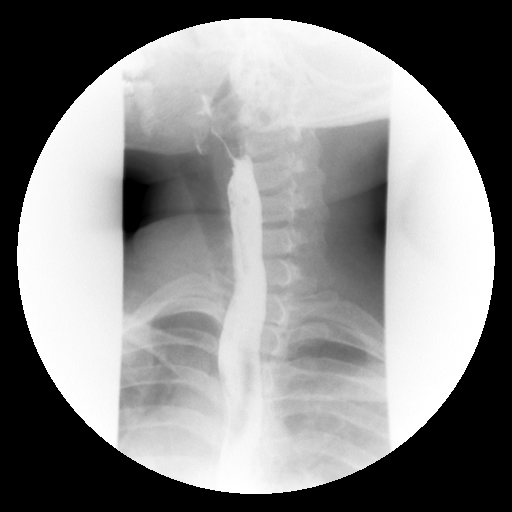

[Series 13: run · 1 of 1 slices shown (9 of 17)]
[im 1/1]
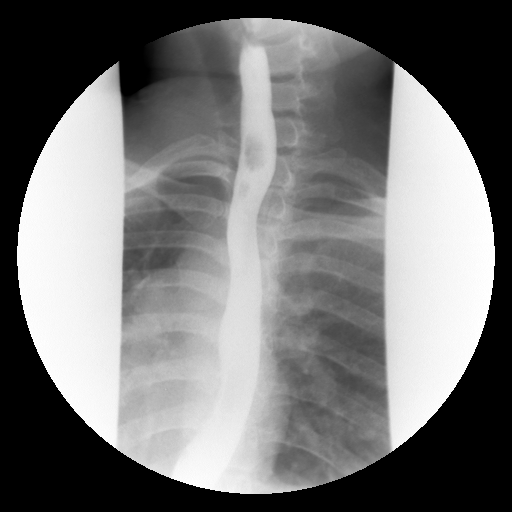

[Series 15: run · 1 of 1 slices shown (10 of 17)]
[im 1/1]
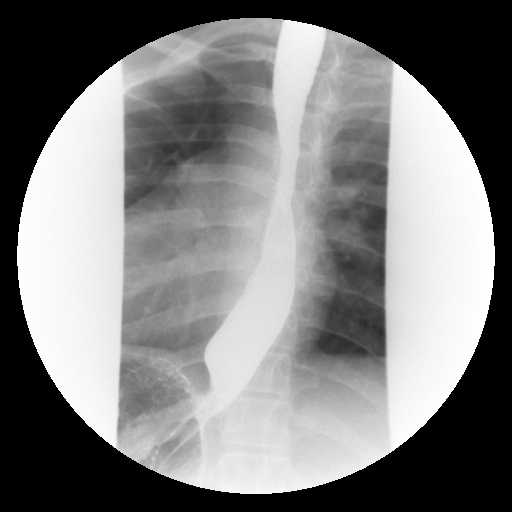

[Series 17: run · 1 of 1 slices shown (11 of 17)]
[im 1/1]
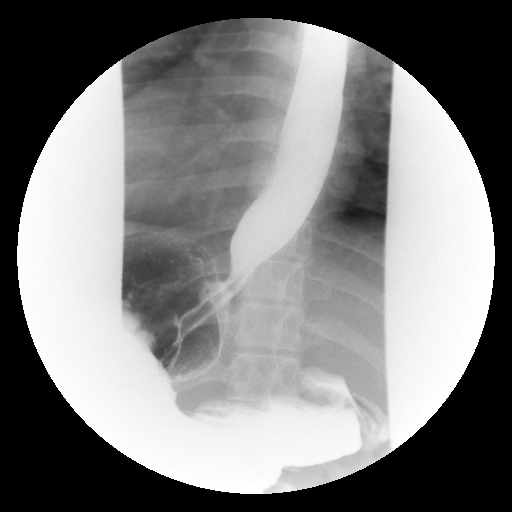

[Series 18: run · 1 of 1 slices shown (12 of 17)]
[im 1/1]
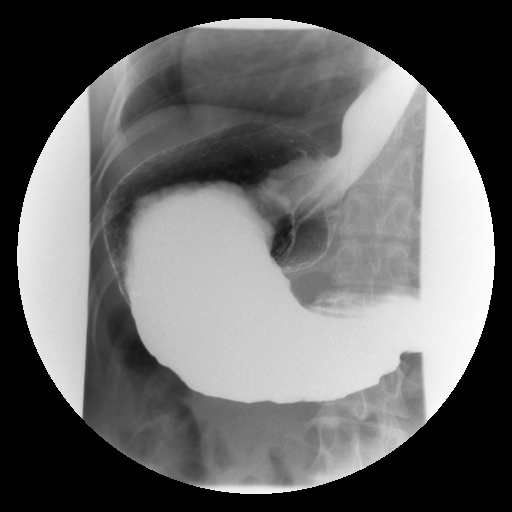

[Series 22: run · 1 of 1 slices shown (13 of 17)]
[im 1/1]
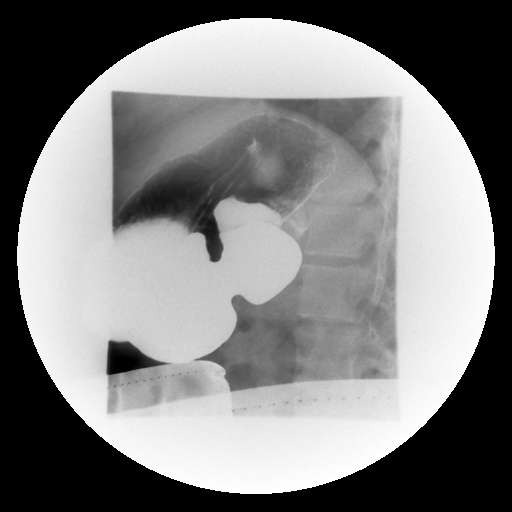

[Series 25: run · 1 of 1 slices shown (14 of 17)]
[im 1/1]
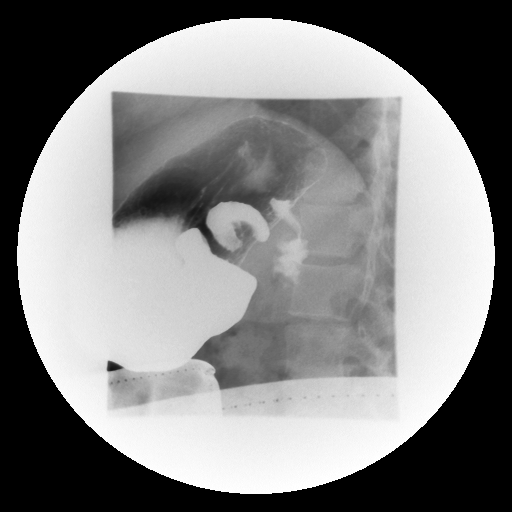

[Series 26: run · 1 of 1 slices shown (15 of 17)]
[im 1/1]
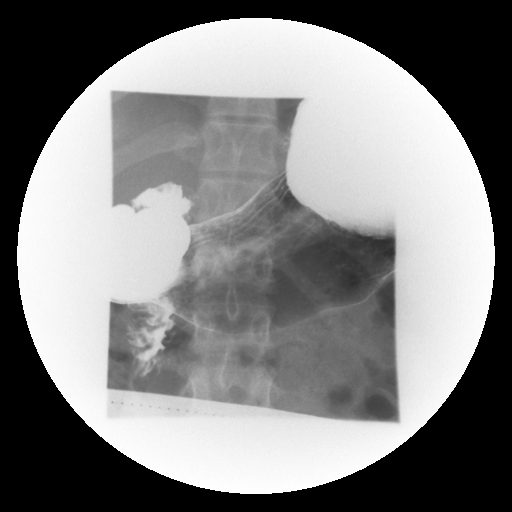

[Series 28: run · 1 of 1 slices shown (16 of 17)]
[im 1/1]
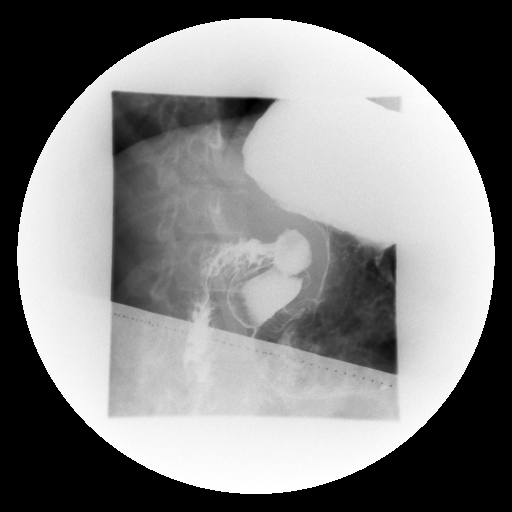

[Series 30: run · 1 of 1 slices shown (17 of 17)]
[im 1/1]
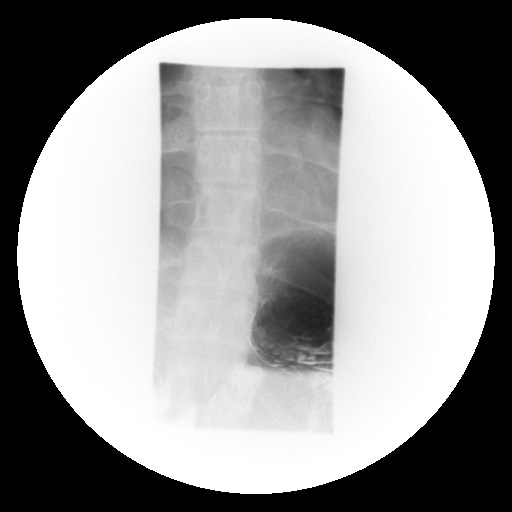

[Series 1001: view not recorded · 0.20mm/px · 1 of 1 slices shown]
[im 1/1]
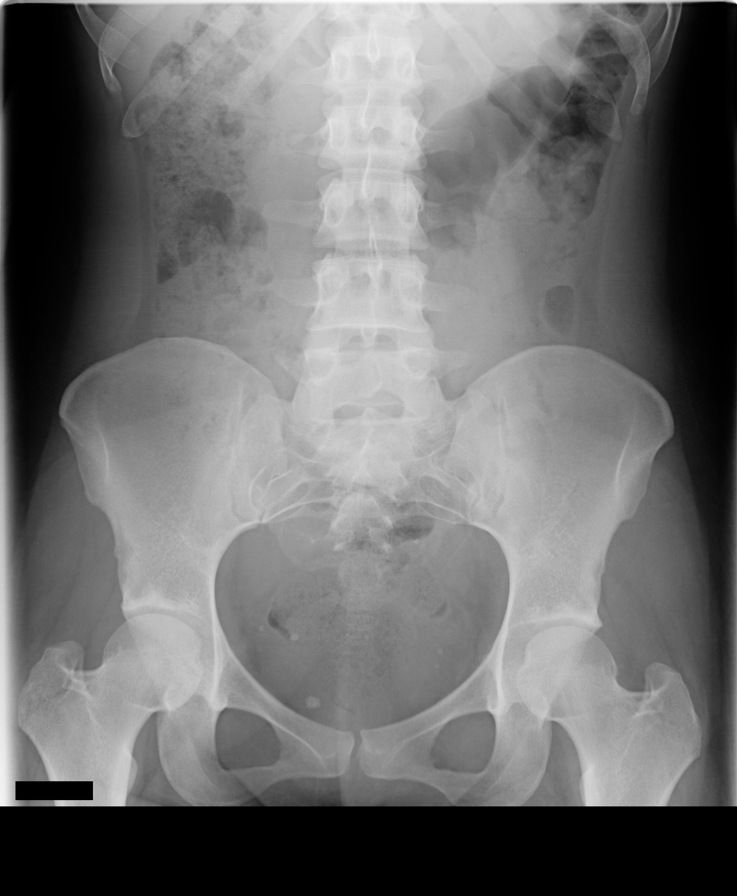

[18 of 24 positions shown; findings below may reference images not displayed]

FINDINGS: A preliminary film of the abdomen shows a nonspecific bowel gas
pattern. No opaque calculi are noted. The bones are unremarkable.

A double contrast upper GI was performed. The mucosa of the
esophagus is unremarkable. A single contrast study shows the
swallowing mechanism to be normal. Esophageal peristalsis is normal.
No hiatal hernia is seen. However, moderate gastroesophageal reflux
is demonstrated. A barium pill was given at the end of the study
which temporarily lodged in the distal esophagus but did pass intact
into the stomach.

The stomach is normal in contour and peristalsis. The duodenal bulb
fills and the duodenal loop is in normal position.
IMPRESSION: 1. Moderate gastroesophageal reflux. No hiatal hernia. Barium pill
temporarily lodges above the gastroesophageal junction but does pass
into the stomach intact.
2. The stomach and duodenum are unremarkable.
